# Patient Record
Sex: Male | Born: 1980 | Hispanic: Yes | Marital: Married | Smoking: Never smoker
Health system: Southern US, Community
[De-identification: ages and names within clinical notes are randomized; demographics above are authoritative.]

## PROBLEM LIST (undated history)

## (undated) DIAGNOSIS — U071 COVID-19: Secondary | ICD-10-CM

---

## 2019-11-02 ENCOUNTER — Other Ambulatory Visit: Payer: Self-pay

## 2019-11-02 ENCOUNTER — Emergency Department
Admission: EM | Admit: 2019-11-02 | Discharge: 2019-11-02 | Disposition: A | Discharge status: Home / Self Care | Payer: Self-pay | Attending: Emergency Medicine | Admitting: Emergency Medicine

## 2019-11-02 ENCOUNTER — Emergency Department: Payer: Self-pay

## 2019-11-02 ENCOUNTER — Encounter: Payer: Self-pay | Admitting: Emergency Medicine

## 2019-11-02 DIAGNOSIS — Y999 Unspecified external cause status: Secondary | ICD-10-CM | POA: Insufficient documentation

## 2019-11-02 DIAGNOSIS — Y939 Activity, unspecified: Secondary | ICD-10-CM | POA: Insufficient documentation

## 2019-11-02 DIAGNOSIS — S52124A Nondisplaced fracture of head of right radius, initial encounter for closed fracture: Secondary | ICD-10-CM

## 2019-11-02 DIAGNOSIS — X58XXXA Exposure to other specified factors, initial encounter: Secondary | ICD-10-CM | POA: Insufficient documentation

## 2019-11-02 DIAGNOSIS — Y929 Unspecified place or not applicable: Secondary | ICD-10-CM | POA: Insufficient documentation

## 2019-11-02 HISTORY — DX: COVID-19: U07.1

## 2019-11-02 MED ORDER — KETOROLAC TROMETHAMINE 30 MG/ML IJ SOLN
30.0000 mg | Freq: Once | INTRAMUSCULAR | Status: AC
  Administered 2019-11-02: 30 mg via INTRAMUSCULAR
  Filled 2019-11-02: qty 1

## 2019-11-02 MED ORDER — TRAMADOL HCL 50 MG PO TABS: 50.0000 mg | ORAL_TABLET | Freq: Four times a day (QID) | ORAL | 0 refills | Status: AC | PRN

## 2019-11-02 MED ORDER — MELOXICAM 15 MG PO TABS: 15.0000 mg | ORAL_TABLET | Freq: Every day | ORAL | 2 refills | Status: AC

## 2019-11-02 NOTE — ED Provider Notes (Signed)
[facility] Emergency Department Provider Note  ____________________________________________   First MD Initiated Contact with Patient 11/02/19 2000     (approximate)  I have reviewed the triage vital signs and the nursing notes.   HISTORY  Chief Complaint Arm Pain    HPI Francisco Zamora is a 39 y.o. male presents emergency department complaining of right elbow pain.  States he injured his elbow about a week ago but has been wearing a wrap on it.  Then he lifted a ice chest again in which she feels like now he has broken his arm.  Increased pain into the elbow.  He denies fever or chills.  Some tingling into the right hand.    Past Medical History:  Diagnosis Date  . COVID-19     There are no problems to display for this patient.   History reviewed. No pertinent surgical history.  Prior to Admission medications   Medication Sig Start Date End Date Taking? Authorizing Provider  meloxicam (MOBIC) 15 MG tablet Take 1 tablet (15 mg total) by mouth daily. 11/02/19 11/01/20  Zakari Couchman, Linden Dolin, PA-C  traMADol (ULTRAM) 50 MG tablet Take 1 tablet (50 mg total) by mouth every 6 (six) hours as needed. 11/02/19   Versie Starks, PA-C    Allergies Patient has no known allergies.  No family history on file.  Social History Social History   Tobacco Use  . Smoking status: Never Smoker  . Smokeless tobacco: Never Used  Substance Use Topics  . Alcohol use: Not on file  . Drug use: Not on file    Review of Systems  Constitutional: No fever/chills Eyes: No visual changes. ENT: No sore throat. Respiratory: Denies cough Cardiovascular: Denies chest pain Genitourinary: Negative for dysuria. Musculoskeletal: Negative for back pain.  Denies shoulder pain or wrist pain.  Complains of right elbow pain Skin: Negative for rash. Psychiatric: no mood changes,     ____________________________________________   PHYSICAL EXAM:  VITAL SIGNS: ED Triage  Vitals  Enc Vitals Group     BP 11/02/19 1930 112/74     Pulse Rate 11/02/19 1930 88     Resp 11/02/19 1930 18     Temp 11/02/19 1930 98.3 F (36.8 C)     Temp Source 11/02/19 1930 Oral     SpO2 11/02/19 1930 98 %     Weight 11/02/19 1926 165 lb (74.8 kg)     Height 11/02/19 1926 5' 8.11" (1.73 m)     Head Circumference --      Peak Flow --      Pain Score 11/02/19 1926 9     Pain Loc --      Pain Edu? --      Excl. in [place]? --     Constitutional: Alert and oriented. Well appearing and in no acute distress. Eyes: Conjunctivae are normal.  Head: Atraumatic. Nose: No congestion/rhinnorhea. Mouth/Throat: Mucous membranes are moist.   Neck:  supple no lymphadenopathy noted Cardiovascular: Normal rate, regular rhythm. Respiratory: Normal respiratory effort.  No retractions, GU: deferred Musculoskeletal: Decreased range of motion of the right elbow, area is tender to palpation, neurovascular is intact, shoulder and wrist are nontender  neurologic:  Normal speech and language.  Skin:  Skin is warm, dry and intact. No rash noted. Psychiatric: Mood and affect are normal. Speech and behavior are normal.  ____________________________________________   LABS (all labs ordered are listed, but only abnormal results are displayed)  Labs Reviewed - No data to  display ____________________________________________   ____________________________________________  RADIOLOGY  X-ray of the right elbow shows a radial head fracture, nondisplaced  ____________________________________________   PROCEDURES  Procedure(s) performed: Sugar tong OCL applied by the tech, sling applied by the tech   Procedures    ____________________________________________   INITIAL IMPRESSION / ASSESSMENT AND PLAN / ED COURSE  Pertinent labs & imaging results that were available during my care of the patient were reviewed by me and considered in my medical decision making (see chart for details).     Patient is 39 year old male presents emergency department complaint of right elbow pain.  Physical exam shows patient to appear well.  Right elbow is tender and has decreased range of motion.  X-ray of the right elbow shows a nondisplaced radial head fracture.  Explained the findings to the patient.  He was placed in a sugar tong OCL, sling.  He was given an injection of Toradol 30 mg IM.  He was given a prescription for meloxicam and tramadol.  He is to follow-up with [facility] orthopedics as they have interpreters.  He is to call and make an appointment.  Apply ice to the right elbow.  He was offered a work note but states he does not need one.  He was discharged in stable condition.    Francisco Zamora was evaluated in Emergency Department on 11/02/2019 for the symptoms described in the history of present illness. He was evaluated in the context of the global COVID-19 pandemic, which necessitated consideration that the patient might be at risk for infection with the SARS-CoV-2 virus that causes COVID-19. Institutional protocols and algorithms that pertain to the evaluation of patients at risk for COVID-19 are in a state of rapid change based on information released by regulatory bodies including the CDC and federal and state organizations. These policies and algorithms were followed during the patient's care in the ED.   As part of my medical decision making, I reviewed the following data within the electronic MEDICAL RECORD NUMBER Nursing notes reviewed and incorporated, Old chart reviewed, Radiograph reviewed , Notes from prior ED visits and [place] Controlled Substance Database  ____________________________________________   FINAL CLINICAL IMPRESSION(S) / ED DIAGNOSES  Final diagnoses:  Closed nondisplaced fracture of head of right radius, initial encounter      NEW MEDICATIONS STARTED DURING THIS VISIT:  New Prescriptions   MELOXICAM (MOBIC) 15 MG TABLET    Take 1 tablet (15 mg  total) by mouth daily.   TRAMADOL (ULTRAM) 50 MG TABLET    Take 1 tablet (50 mg total) by mouth every 6 (six) hours as needed.     Note:  This document was prepared using Dragon voice recognition software and may include unintentional dictation errors.    Faythe Ghee, PA-C 11/02/19 2128    Shaune Pollack, MD 11/07/19 2156

## 2019-11-02 NOTE — Discharge Instructions (Addendum)
Call [facility] orthopedics for an appointment Wear the splint until evaluated by orthopedics Wear the sling for comfort Take the meloxicam daily Tramadol for pain not controlled by meloxicam

## 2019-11-02 NOTE — ED Triage Notes (Signed)
Triage assessment completed with Face-to-Face Spanish interpreter [place].   Pt arrived via POV with reports of right arm pain. Pt states he broke his right arm 1 week ago, states the pain decreased, pt states today when he moved his arm the pain returned when he was holding a heavy ice box.  Pt states he broke it 10 days ago holding an ice box as well.  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 Pt states he used some bandages around it for support.  Pt states this is not a workman's comp case.

## 2019-11-12 ENCOUNTER — Ambulatory Visit: Payer: Self-pay | Attending: Internal Medicine

## 2019-11-12 DIAGNOSIS — Z23 Encounter for immunization: Secondary | ICD-10-CM

## 2019-11-12 NOTE — Progress Notes (Signed)
   Covid-19 Vaccination Clinic  Name:  Hezzie Karim    MRN: [id number] DOB: 09-Jun-1981  11/12/2019  Mr. Menendez-Pineda was observed post Covid-19 immunization for 15 minutes without incident. He was provided with Vaccine Information Sheet and instruction to access the V-Safe system.   Mr. Stcharles was instructed to call 911 with any severe reactions post vaccine: Marland Kitchen Difficulty breathing  . Swelling of face and throat  . A fast heartbeat  . A bad rash all over body  . Dizziness and weakness   Immunizations Administered    Name Date Dose VIS Date Route   Pfizer COVID-19 Vaccine 11/12/2019  4:53 PM 0.3 mL 08/06/2019 Intramuscular   Manufacturer: [employer], [employer]   Lot: [id number]   NDC: [address]-7

## 2020-12-11 IMAGING — CR DG ELBOW COMPLETE 3+V*R*
1 series · 4 of 4 positions shown · non-contrast
Comparison: None.

CLINICAL DATA: History of possible previous fracture with elbow
pain, initial encounter

EXAM:
RIGHT ELBOW - COMPLETE 3+ VIEW

[Series 1: x elbow lat right · 0.14mm/px · 4 of 4 slices shown]
[im 1/4]
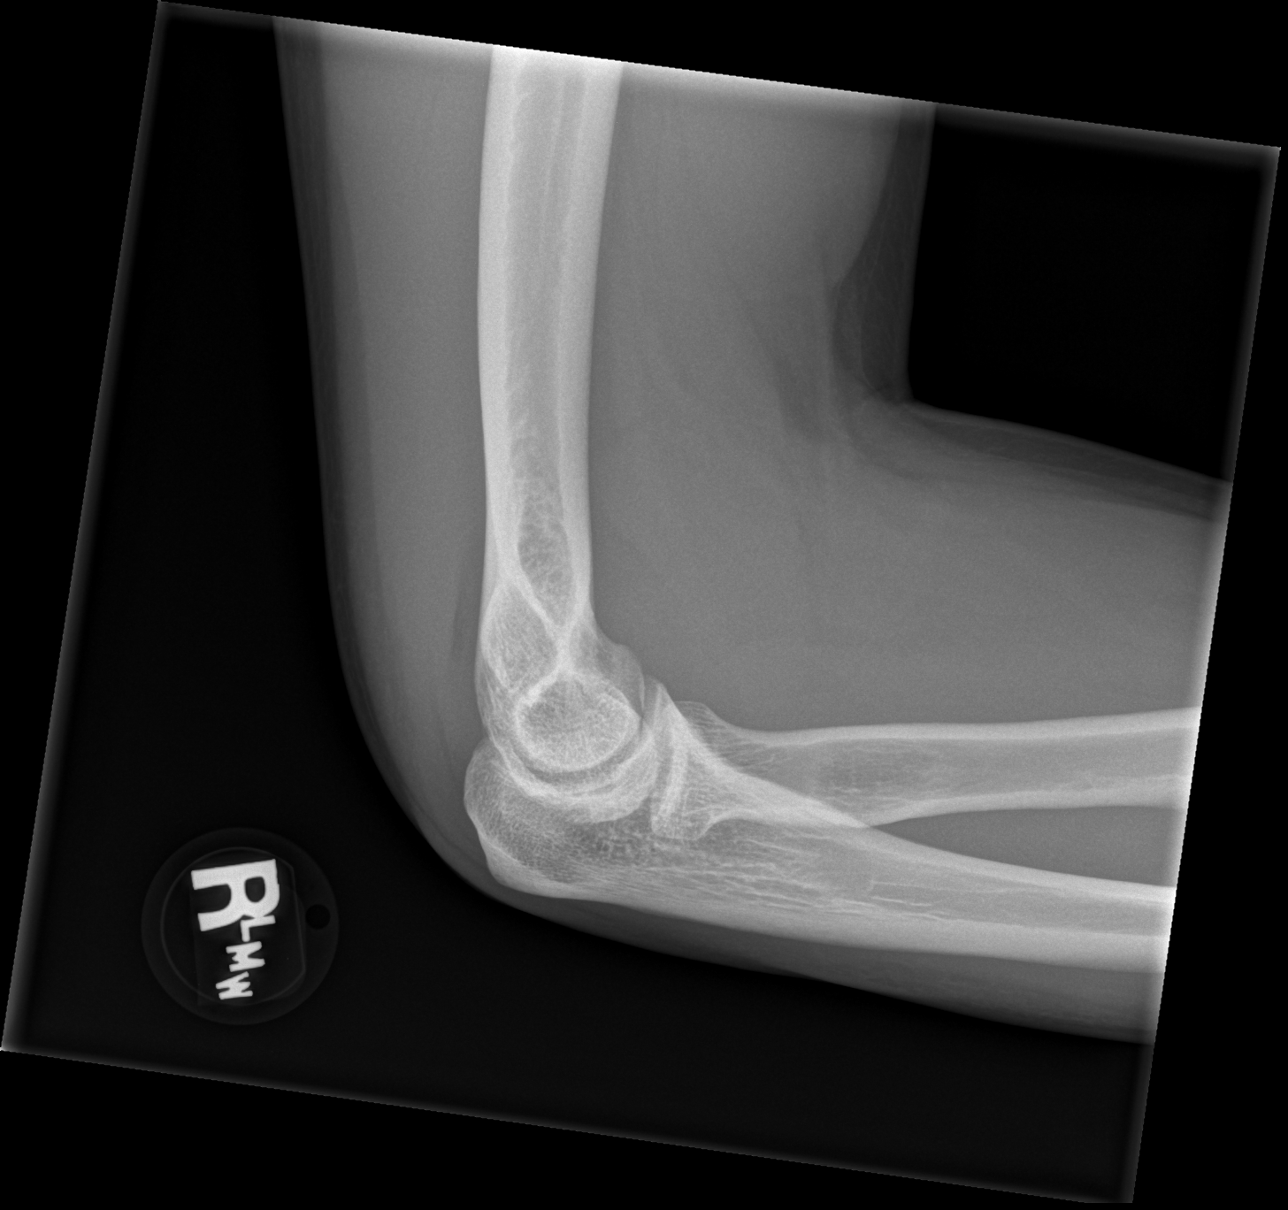
[im 2/4]
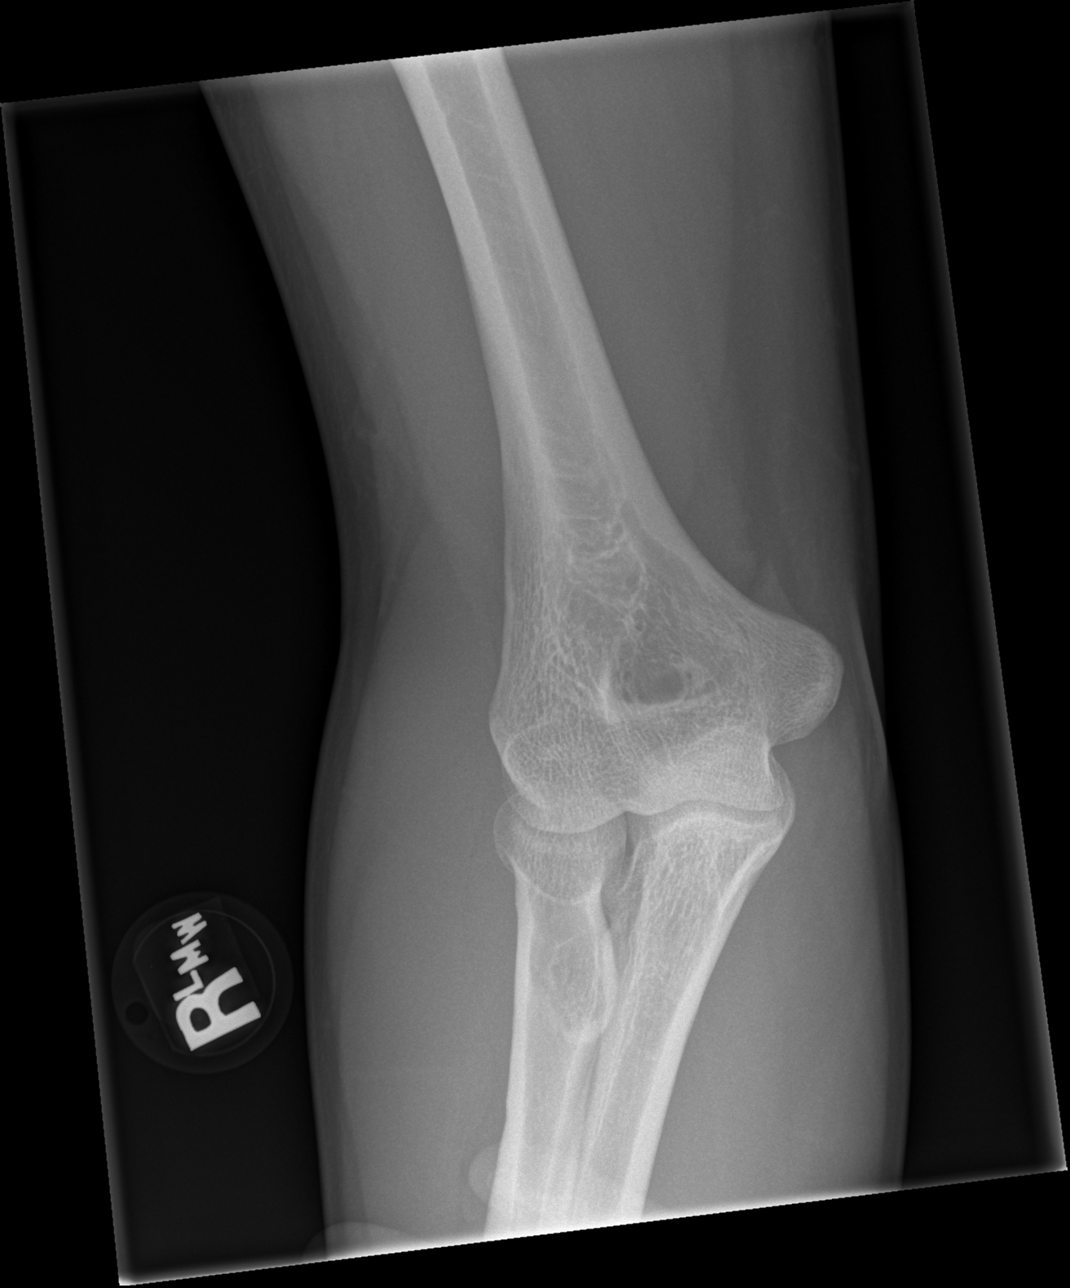
[im 3/4]
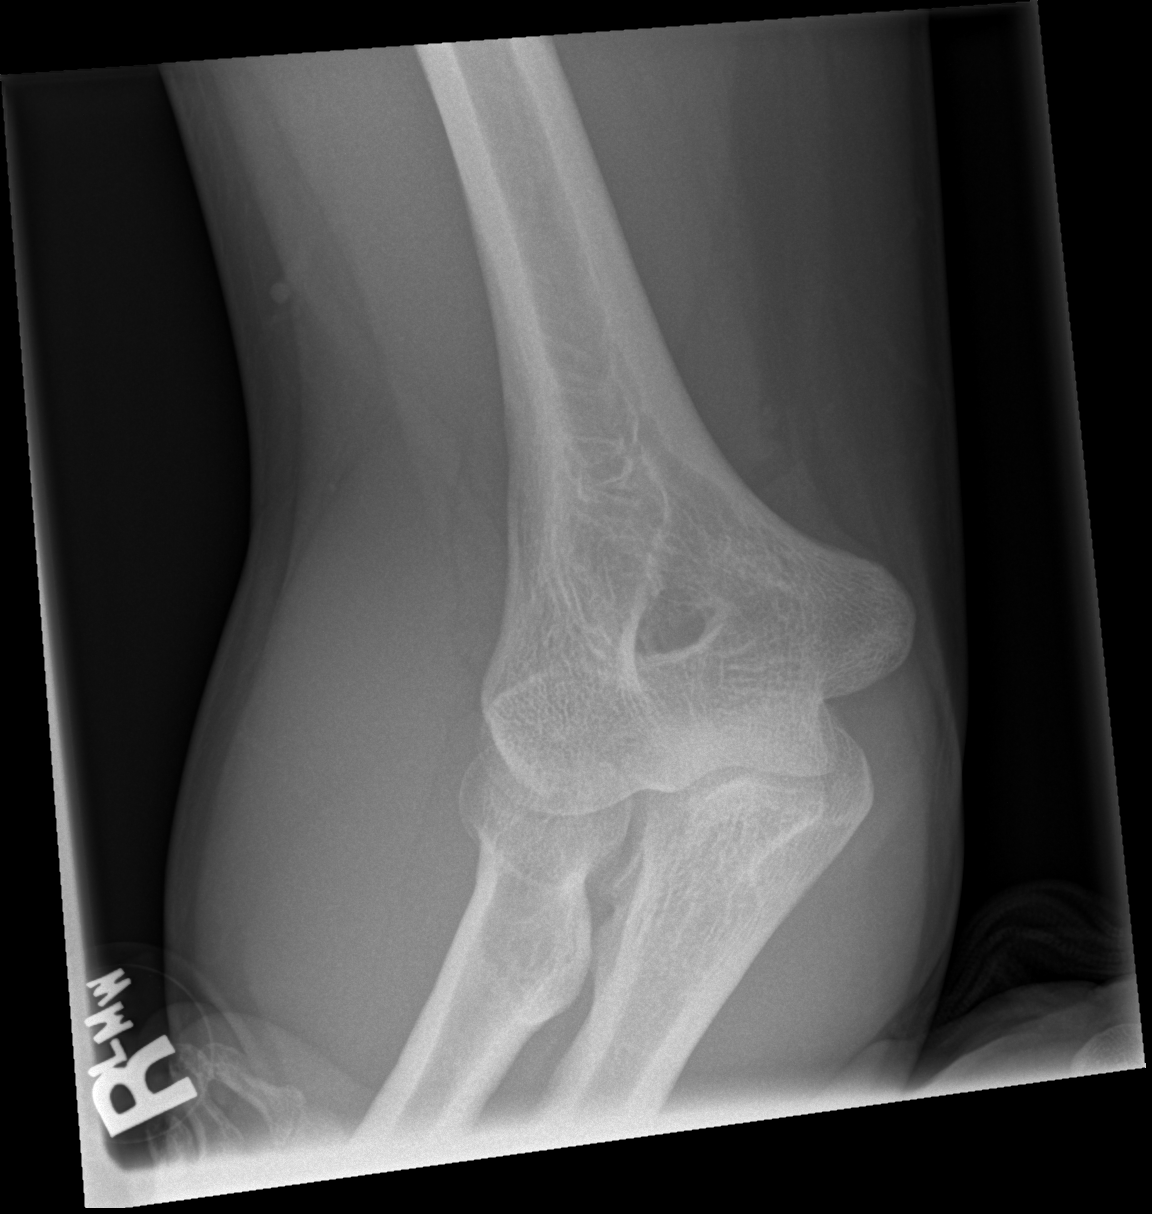
[im 4/4]
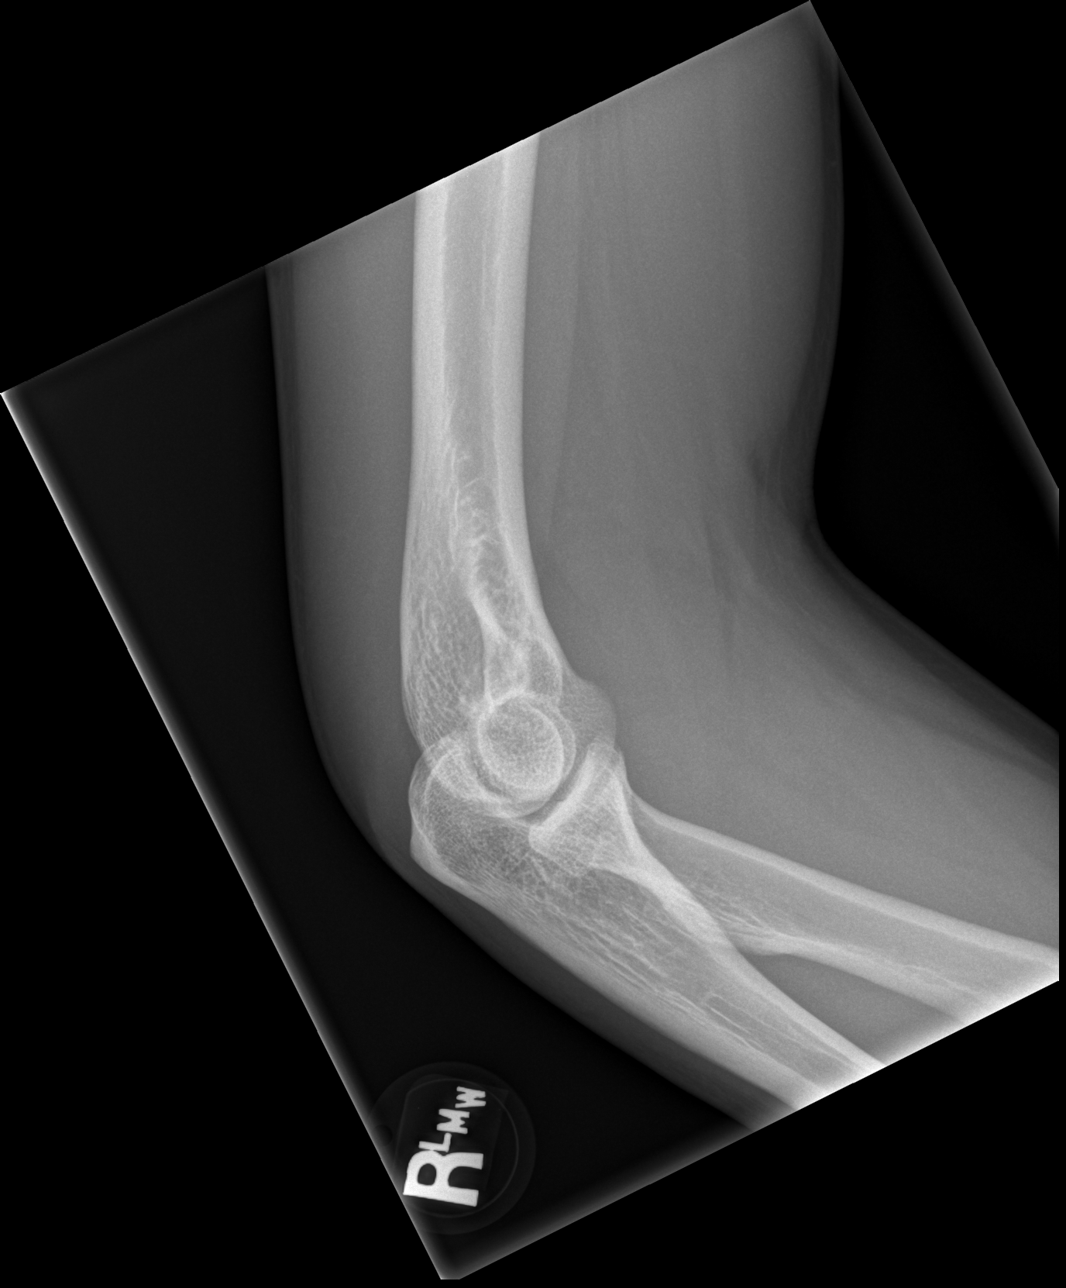

[4 of 4 positions shown; findings below may reference images not displayed]

FINDINGS: Elevation of the anterior and posterior fat pads is noted. No CT is
noted within the radial head although incompletely evaluated on
these images due to poor patient positioning. These changes are
suspicious for undisplaced radial head fracture.
IMPRESSION: Changes of joint effusion and suspicious for undisplaced radial head
fracture
